# Patient Record
Sex: Female | Born: 1950 | Race: Black or African American | Hispanic: No | Smoking: Never smoker
Health system: Southern US, Community
[De-identification: ages and names within clinical notes are randomized; demographics above are authoritative.]

## PROBLEM LIST (undated history)

## (undated) DIAGNOSIS — E785 Hyperlipidemia, unspecified: Secondary | ICD-10-CM

## (undated) DIAGNOSIS — I10 Essential (primary) hypertension: Secondary | ICD-10-CM

---

## 2004-08-25 ENCOUNTER — Ambulatory Visit: Payer: Self-pay | Admitting: Family Medicine

## 2004-09-18 ENCOUNTER — Ambulatory Visit: Payer: Self-pay

## 2005-09-09 ENCOUNTER — Ambulatory Visit: Payer: Self-pay | Admitting: Family Medicine

## 2006-10-20 ENCOUNTER — Ambulatory Visit: Payer: Self-pay | Admitting: Family Medicine

## 2008-02-06 ENCOUNTER — Ambulatory Visit: Payer: Self-pay | Admitting: Family Medicine

## 2009-02-10 ENCOUNTER — Ambulatory Visit: Payer: Self-pay | Admitting: Family Medicine

## 2010-02-11 ENCOUNTER — Ambulatory Visit: Payer: Self-pay | Admitting: Family Medicine

## 2010-07-28 ENCOUNTER — Ambulatory Visit: Payer: Self-pay | Admitting: Internal Medicine

## 2011-04-01 ENCOUNTER — Ambulatory Visit: Payer: Self-pay | Admitting: Family Medicine

## 2012-07-17 ENCOUNTER — Ambulatory Visit: Payer: Self-pay | Admitting: Family Medicine

## 2013-07-23 ENCOUNTER — Ambulatory Visit: Payer: Self-pay | Admitting: Family Medicine

## 2013-10-17 ENCOUNTER — Emergency Department: Payer: Self-pay | Admitting: Emergency Medicine

## 2013-10-17 LAB — CBC WITH DIFFERENTIAL/PLATELET
BASOS ABS: 0 10*3/uL (ref 0.0–0.1)
BASOS PCT: 0.7 %
EOS ABS: 0 10*3/uL (ref 0.0–0.7)
Eosinophil %: 1 %
HCT: 43.3 % (ref 35.0–47.0)
HGB: 14 g/dL (ref 12.0–16.0)
LYMPHS ABS: 1.4 10*3/uL (ref 1.0–3.6)
LYMPHS PCT: 28.3 %
MCH: 27.9 pg (ref 26.0–34.0)
MCHC: 32.3 g/dL (ref 32.0–36.0)
MCV: 86 fL (ref 80–100)
Monocyte #: 0.4 x10 3/mm (ref 0.2–0.9)
Monocyte %: 8.8 %
Neutrophil #: 3 10*3/uL (ref 1.4–6.5)
Neutrophil %: 61.2 %
PLATELETS: 309 10*3/uL (ref 150–440)
RBC: 5.02 10*6/uL (ref 3.80–5.20)
RDW: 13.2 % (ref 11.5–14.5)
WBC: 4.9 10*3/uL (ref 3.6–11.0)

## 2013-10-17 LAB — BASIC METABOLIC PANEL
ANION GAP: 5 — AB (ref 7–16)
BUN: 19 mg/dL — ABNORMAL HIGH (ref 7–18)
CALCIUM: 9.4 mg/dL (ref 8.5–10.1)
CHLORIDE: 103 mmol/L (ref 98–107)
CO2: 30 mmol/L (ref 21–32)
Creatinine: 1.17 mg/dL (ref 0.60–1.30)
EGFR (African American): 57 — ABNORMAL LOW
EGFR (Non-African Amer.): 50 — ABNORMAL LOW
Glucose: 88 mg/dL (ref 65–99)
Osmolality: 277 (ref 275–301)
Potassium: 3.7 mmol/L (ref 3.5–5.1)
SODIUM: 138 mmol/L (ref 136–145)

## 2014-07-25 ENCOUNTER — Ambulatory Visit: Payer: Self-pay | Admitting: Family Medicine

## 2014-11-07 ENCOUNTER — Encounter: Payer: Self-pay | Admitting: *Deleted

## 2014-11-07 ENCOUNTER — Encounter
Admission: RE | Disposition: A | Discharge status: Home / Self Care | Payer: Self-pay | Source: Ambulatory Visit | Attending: Gastroenterology

## 2014-11-07 ENCOUNTER — Ambulatory Visit: Payer: 59 | Admitting: Anesthesiology

## 2014-11-07 ENCOUNTER — Ambulatory Visit
Admission: RE | Admit: 2014-11-07 | Discharge: 2014-11-07 | Disposition: A | Discharge status: Home / Self Care | Payer: 59 | Source: Ambulatory Visit | Attending: Gastroenterology | Admitting: Gastroenterology

## 2014-11-07 DIAGNOSIS — K5730 Diverticulosis of large intestine without perforation or abscess without bleeding: Secondary | ICD-10-CM | POA: Insufficient documentation

## 2014-11-07 DIAGNOSIS — I10 Essential (primary) hypertension: Secondary | ICD-10-CM | POA: Diagnosis not present

## 2014-11-07 DIAGNOSIS — Z7982 Long term (current) use of aspirin: Secondary | ICD-10-CM | POA: Diagnosis not present

## 2014-11-07 DIAGNOSIS — Z1211 Encounter for screening for malignant neoplasm of colon: Secondary | ICD-10-CM | POA: Insufficient documentation

## 2014-11-07 DIAGNOSIS — Z79899 Other long term (current) drug therapy: Secondary | ICD-10-CM | POA: Diagnosis not present

## 2014-11-07 HISTORY — PX: COLONOSCOPY: SHX5424

## 2014-11-07 HISTORY — DX: Essential (primary) hypertension: I10

## 2014-11-07 SURGERY — COLONOSCOPY
Anesthesia: General

## 2014-11-07 MED ORDER — SODIUM CHLORIDE 0.9 % IV SOLN
INTRAVENOUS | Status: DC
  Administered 2014-11-07: 08:00:00 via INTRAVENOUS

## 2014-11-07 MED ORDER — PROPOFOL 10 MG/ML IV BOLUS
INTRAVENOUS | Status: DC | PRN
  Administered 2014-11-07: 50 mg via INTRAVENOUS

## 2014-11-07 MED ORDER — PROPOFOL INFUSION 10 MG/ML OPTIME
INTRAVENOUS | Status: DC | PRN
  Administered 2014-11-07: 100 ug/kg/min via INTRAVENOUS

## 2014-11-07 MED ORDER — PHENYLEPHRINE HCL 10 MG/ML IJ SOLN
INTRAMUSCULAR | Status: DC | PRN
  Administered 2014-11-07 (×2): 100 ug via INTRAVENOUS

## 2014-11-07 MED ORDER — LIDOCAINE HCL (CARDIAC) 20 MG/ML IV SOLN
INTRAVENOUS | Status: DC | PRN
  Administered 2014-11-07: 60 mg via INTRAVENOUS

## 2014-11-07 NOTE — Anesthesia Postprocedure Evaluation (Signed)
  Anesthesia Post-op Note  Patient: Tina Barrett  Procedure(s) Performed: Procedure(s): COLONOSCOPY (N/A)  Anesthesia type:General  Patient location: PACU  Post pain: Pain level controlled  Post assessment: Post-op Vital signs reviewed, Patient's Cardiovascular Status Stable, Respiratory Function Stable, Patent Airway and No signs of Nausea or vomiting  Post vital signs: Reviewed and stable  Last Vitals:  Filed Vitals:   11/07/14 0900  BP:   Pulse: 66  Temp:   Resp: 16    Level of consciousness: awake, alert  and patient cooperative  Complications: No apparent anesthesia complications

## 2014-11-07 NOTE — Anesthesia Preprocedure Evaluation (Signed)
Anesthesia Evaluation  Patient identified by MRN, date of birth, ID band Patient awake    Reviewed: Allergy & Precautions, NPO status , Patient's Chart, lab work & pertinent test results  History of Anesthesia Complications Negative for: history of anesthetic complications  Airway Mallampati: II  TM Distance: >3 FB Neck ROM: Full    Dental  (+) Teeth Intact   Pulmonary          Cardiovascular hypertension (pt off meds now),     Neuro/Psych    GI/Hepatic   Endo/Other    Renal/GU      Musculoskeletal   Abdominal   Peds  Hematology   Anesthesia Other Findings   Reproductive/Obstetrics                             Anesthesia Physical Anesthesia Plan  ASA: II  Anesthesia Plan: General   Post-op Pain Management:    Induction: Intravenous  Airway Management Planned: Nasal Cannula  Additional Equipment:   Intra-op Plan:   Post-operative Plan:   Informed Consent: I have reviewed the patients History and Physical, chart, labs and discussed the procedure including the risks, benefits and alternatives for the proposed anesthesia with the patient or authorized representative who has indicated his/her understanding and acceptance.     Plan Discussed with:   Anesthesia Plan Comments:         Anesthesia Quick Evaluation

## 2014-11-07 NOTE — H&P (Signed)
Outpatient short stay form Pre-procedure 11/07/2014 8:09 AM Christena Deem MD  Primary Physician: Dr. Dorothey Baseman  Reason for visit:  Screening colonoscopy  History of present illness:  Patient is 64 year old female presenting today for screening colonoscopy. No family history of colon cancer or colon polyps. She has not taken an aspirin several days. He does not take anticoagulation medications. Tolerated her prep well. He did have some emesis with a very last part of it.    Current facility-administered medications:  .  0.9 %  sodium chloride infusion, , Intravenous, Continuous, Christena Deem, MD, Last Rate: 50 mL/hr at 11/07/14 0737 .  0.9 %  sodium chloride infusion, , Intravenous, Continuous, Christena Deem, MD  Prescriptions prior to admission  Medication Sig Dispense Refill Last Dose  . aspirin 81 MG tablet Take 81 mg by mouth daily.   Past Week at Unknown time  . Omega-3 Fatty Acids (FISH OIL) 1000 MG CAPS Take 1 capsule by mouth daily.   Past Week at Unknown time  . triamterene-hydrochlorothiazide (DYAZIDE) 37.5-25 MG per capsule Take 1 capsule by mouth daily.   Completed Course at Unknown time     No Known Allergies   Past Medical History  Diagnosis Date  . Hypertension     Review of systems:      Physical Exam    Heart and lungs: Irregular rhythm possible trigeminy. No rubs or gallop. Lungs are bilaterally clear to auscultation    HEENT: Normocephalic atraumatic eyes are anicteric    Other:     Pertinant exam for procedure: Soft nontender nondistended bowel sounds positive normoactive    Planned proceedures: Colonoscopy and indicated procedures. I have discussed the risks benefits and complications of procedures to include not limited to bleeding, infection, perforation and the risk of sedation and the patient wishes to proceed. I have discussed the risks benefits and complications of procedures to include not limited to bleeding, infection,  perforation and the risk of sedation and the patient wishes to proceed.    Christena Deem, MD Gastroenterology 11/07/2014  8:09 AM

## 2014-11-07 NOTE — Op Note (Signed)
[facility] Gastroenterology Patient Name: Tina Barrett Procedure Date: 11/07/2014 8:17 AM MRN: [id number] Account #: [id number] Date of Birth: May 10, 1951 Admit Type: Outpatient Age: 64 Room: [facility] ENDO ROOM 4 Gender: Female Note Status: Finalized Procedure:         Colonoscopy Indications:       Screening for colorectal malignant neoplasm Providers:         Christena Deem, MD Referring MD:      Teena Irani. Terance Hart, MD (Referring MD) Medicines:         Monitored Anesthesia Care Complications:     No immediate complications. Procedure:         Pre-Anesthesia Assessment:                    - ASA Grade Assessment: II - A patient with mild systemic                     disease.                    After obtaining informed consent, the colonoscope was                     passed under direct vision. Throughout the procedure, the                     patient's blood pressure, pulse, and oxygen saturations                     were monitored continuously. The Colonoscope was                     introduced through the anus and advanced to the the cecum,                     identified by appendiceal orifice and ileocecal valve. The                     colonoscopy was performed without difficulty. The patient                     tolerated the procedure well. The quality of the bowel                     preparation was good. Findings:      Multiple small-mouthed diverticula were found in the sigmoid colon and       in the distal descending colon.      Multiple small-mouthed diverticula were found in the sigmoid colon.       Petechia were visualized in association with the diverticular opening.      The exam was otherwise normal throughout the examined colon.      The retroflexed view of the distal rectum and anal verge was normal and       showed no anal or rectal abnormalities.      The digital rectal exam was normal. Impression:        - Diverticulosis in the sigmoid colon  and in the distal                     descending colon.                    - Mild diverticulosis in the sigmoid colon. Petechia were  visualized in association with the diverticular opening.                    - The distal rectum and anal verge are normal on                     retroflexion view.                    - No specimens collected. Recommendation:    - Use Citrucel one tablespoon PO daily daily. Procedure Code(s): --- Professional ---                    [phone], Colonoscopy, flexible; diagnostic, including                     collection of specimen(s) by brushing or washing, when                     performed (separate procedure) Diagnosis Code(s): --- Professional ---                    V76.51, Special screening for malignant neoplasms of colon                    562.10, Diverticulosis of colon (without mention of                     hemorrhage) CPT copyright 2014 American Medical Association. All rights reserved. The codes documented in this report are preliminary and upon coder review may  be revised to meet current compliance requirements. Christena Deem, MD 11/07/2014 8:41:41 AM This report has been signed electronically. Number of Addenda: 0 Note Initiated On: 11/07/2014 8:17 AM Scope Withdrawal Time: 0 hours 6 minutes 11 seconds  Total Procedure Duration: 0 hours 11 minutes 40 seconds       [facility]

## 2014-11-07 NOTE — Transfer of Care (Signed)
Immediate Anesthesia Transfer of Care Note  Patient: Tina Barrett  Procedure(s) Performed: Procedure(s): COLONOSCOPY (N/A)  Patient Location: Endoscopy Unit  Anesthesia Type:General  Level of Consciousness: awake, alert , oriented and patient cooperative  Airway & Oxygen Therapy: Patient Spontanous Breathing and Patient connected to nasal cannula oxygen  Post-op Assessment: Report given to RN, Post -op Vital signs reviewed and stable and Patient moving all extremities X 4  Post vital signs: Reviewed and stable  Last Vitals:  Filed Vitals:   11/07/14 0849  BP: 107/61  Pulse: 71  Temp: 35.6 C  Resp: 17    Complications: No apparent anesthesia complications

## 2014-11-11 ENCOUNTER — Encounter: Payer: Self-pay | Admitting: Gastroenterology

## 2015-07-24 ENCOUNTER — Other Ambulatory Visit: Payer: Self-pay | Admitting: Family Medicine

## 2015-07-24 DIAGNOSIS — Z1231 Encounter for screening mammogram for malignant neoplasm of breast: Secondary | ICD-10-CM

## 2015-08-07 ENCOUNTER — Ambulatory Visit: Payer: 59

## 2015-08-18 ENCOUNTER — Ambulatory Visit
Admission: RE | Admit: 2015-08-18 | Discharge: 2015-08-18 | Disposition: A | Discharge status: Home / Self Care | Payer: Medicare Other | Source: Ambulatory Visit | Attending: Family Medicine | Admitting: Family Medicine

## 2015-08-18 ENCOUNTER — Other Ambulatory Visit: Payer: Self-pay | Admitting: Family Medicine

## 2015-08-18 DIAGNOSIS — Z1231 Encounter for screening mammogram for malignant neoplasm of breast: Secondary | ICD-10-CM

## 2015-10-29 ENCOUNTER — Other Ambulatory Visit: Payer: Self-pay | Admitting: Unknown Physician Specialty

## 2015-10-29 DIAGNOSIS — R0789 Other chest pain: Secondary | ICD-10-CM

## 2015-10-30 ENCOUNTER — Ambulatory Visit
Admission: RE | Admit: 2015-10-30 | Discharge: 2015-10-30 | Disposition: A | Discharge status: Home / Self Care | Payer: Medicare Other | Source: Ambulatory Visit | Attending: Unknown Physician Specialty | Admitting: Unknown Physician Specialty

## 2015-10-30 DIAGNOSIS — R0789 Other chest pain: Secondary | ICD-10-CM | POA: Insufficient documentation

## 2015-10-30 DIAGNOSIS — M47894 Other spondylosis, thoracic region: Secondary | ICD-10-CM | POA: Diagnosis not present

## 2015-10-30 MED ORDER — IOPAMIDOL (ISOVUE-300) INJECTION 61%
75.0000 mL | Freq: Once | INTRAVENOUS | Status: AC | PRN
  Administered 2015-10-30: 75 mL via INTRAVENOUS

## 2016-07-20 ENCOUNTER — Other Ambulatory Visit: Payer: Self-pay | Admitting: Family Medicine

## 2016-07-20 DIAGNOSIS — Z1239 Encounter for other screening for malignant neoplasm of breast: Secondary | ICD-10-CM

## 2016-08-19 ENCOUNTER — Ambulatory Visit
Admission: RE | Admit: 2016-08-19 | Discharge: 2016-08-19 | Disposition: A | Discharge status: Home / Self Care | Payer: 59 | Source: Ambulatory Visit | Attending: Family Medicine | Admitting: Family Medicine

## 2016-08-19 DIAGNOSIS — Z1239 Encounter for other screening for malignant neoplasm of breast: Secondary | ICD-10-CM

## 2016-08-19 DIAGNOSIS — Z1231 Encounter for screening mammogram for malignant neoplasm of breast: Secondary | ICD-10-CM | POA: Insufficient documentation

## 2017-01-16 ENCOUNTER — Encounter: Payer: Self-pay | Admitting: Emergency Medicine

## 2017-01-16 ENCOUNTER — Emergency Department
Admission: EM | Admit: 2017-01-16 | Discharge: 2017-01-16 | Disposition: A | Discharge status: Home / Self Care | Payer: Medicare Other | Attending: Emergency Medicine | Admitting: Emergency Medicine

## 2017-01-16 DIAGNOSIS — Z7982 Long term (current) use of aspirin: Secondary | ICD-10-CM | POA: Insufficient documentation

## 2017-01-16 DIAGNOSIS — L508 Other urticaria: Secondary | ICD-10-CM | POA: Diagnosis not present

## 2017-01-16 DIAGNOSIS — Z79899 Other long term (current) drug therapy: Secondary | ICD-10-CM | POA: Insufficient documentation

## 2017-01-16 DIAGNOSIS — L509 Urticaria, unspecified: Secondary | ICD-10-CM | POA: Diagnosis present

## 2017-01-16 MED ORDER — PREDNISONE 10 MG PO TABS: ORAL_TABLET | ORAL | 0 refills | Status: AC

## 2017-01-16 MED ORDER — DEXAMETHASONE SODIUM PHOSPHATE 10 MG/ML IJ SOLN
10.0000 mg | Freq: Once | INTRAMUSCULAR | Status: AC
  Administered 2017-01-16: 10 mg via INTRAVENOUS
  Filled 2017-01-16: qty 1

## 2017-01-16 MED ORDER — DIPHENHYDRAMINE HCL 50 MG/ML IJ SOLN
25.0000 mg | Freq: Once | INTRAMUSCULAR | Status: AC
  Administered 2017-01-16: 25 mg via INTRAVENOUS
  Filled 2017-01-16: qty 1

## 2017-01-16 MED ORDER — FAMOTIDINE IN NACL 20-0.9 MG/50ML-% IV SOLN
20.0000 mg | Freq: Once | INTRAVENOUS | Status: AC
  Administered 2017-01-16: 20 mg via INTRAVENOUS
  Filled 2017-01-16: qty 50

## 2017-01-16 MED ORDER — RANITIDINE HCL 150 MG PO TABS: 150.0000 mg | ORAL_TABLET | Freq: Two times a day (BID) | ORAL | 0 refills | Status: AC

## 2017-01-16 NOTE — ED Provider Notes (Signed)
[facility] Emergency Department Provider Note  ___________________________________________   First MD Initiated Contact with Patient 01/16/17 1144     (approximate)  I have reviewed the triage vital signs and the nursing notes.   HISTORY  Chief Complaint Urticaria   HPI Tina Barrett is a 66 y.o. female is here with complaint of hives. Patient states she noted yesterday with hives but did not have any problems breathing and despite her husband's request to come to the emergency room she decided stay home. This morning she has more hives but still no difficulty with swallowing, talking or breathing. Patient denies any previous problems. She is not taking any over-the-counter medication.   History reviewed. No pertinent past medical history.  There are no active problems to display for this patient.   Past Surgical History:  Procedure Laterality Date  . COLONOSCOPY N/A 11/07/2014   Procedure: COLONOSCOPY;  Surgeon: Christena Deem, MD;  Location: [facility] ENDOSCOPY;  Service: Endoscopy;  Laterality: N/A;    Prior to Admission medications   Medication Sig Start Date End Date Taking? Authorizing Provider  aspirin 81 MG tablet Take 81 mg by mouth daily.    [provider]  Omega-3 Fatty Acids (FISH OIL) 1000 MG CAPS Take 1 capsule by mouth daily.    [provider]  predniSONE (DELTASONE) 10 MG tablet Take 6 tablets  today, on day 2 take 5 tablets, day 3 take 4 tablets, day 4 take 3 tablets, day 5 take  2 tablets and 1 tablet the last day 01/16/17   Tommi Rumps, PA-C  ranitidine (ZANTAC) 150 MG tablet Take 1 tablet (150 mg total) by mouth 2 (two) times daily. 01/16/17 01/16/18  Tommi Rumps, PA-C  triamterene-hydrochlorothiazide (DYAZIDE) 37.5-25 MG per capsule Take 1 capsule by mouth daily.    [provider]    Allergies Patient has no known allergies.  Family History  Problem Relation Age of Onset  . Breast  cancer Maternal Aunt     Social History Social History  Substance Use Topics  . Smoking status: Never Smoker  . Smokeless tobacco: Never Used  . Alcohol use No    Review of Systems Constitutional: No fever/chills Eyes: No visual changes. ENT: No sore throat.Negative for difficulty swallowing or speaking. Cardiovascular: Denies chest pain. Respiratory: Denies shortness of breath. Skin: Positive for rash and itching. Neurological: Negative for headaches, focal weakness or numbness.   ____________________________________________   PHYSICAL EXAM:  VITAL SIGNS: ED Triage Vitals  Enc Vitals Group     BP      Pulse      Resp      Temp      Temp src      SpO2      Weight      Height      Head Circumference      Peak Flow      Pain Score      Pain Loc      Pain Edu?      Excl. in GC?    Constitutional: Alert and oriented. Well appearing and in no acute distress. Eyes: Conjunctivae are normal. Head: Atraumatic. Nose: No congestion/rhinnorhea. Mouth/Throat: Mucous membranes are moist.  Oropharynx non-erythematous.No edema and uvula is midline. Neck: No stridor.   Cardiovascular: Normal rate, rhythm. Grossly normal heart sounds.  Good peripheral circulation. Respiratory: Normal respiratory effort.  No retractions. Lungs CTAB. Musculoskeletal: Moves upper and lower extremities without difficulty and normal gait was noted.  Neurologic:  Normal speech and language. No gross focal neurologic deficits are appreciated.  Skin:  Skin is warm, dry and intact. Irregular, erythematous patches diffusely over upper and lower extremities. Also noted on anterior and posterior trunk. Psychiatric: Mood and affect are normal. Speech and behavior are normal.  ____________________________________________   LABS (all labs ordered are listed, but only abnormal results are displayed)  Labs Reviewed - No data to display   PROCEDURES  Procedure(s) performed:  None  Procedures  Critical Care performed: No  ____________________________________________   INITIAL IMPRESSION / ASSESSMENT AND PLAN / ED COURSE  Pertinent labs & imaging results that were available during my care of the patient were reviewed by me and considered in my medical decision making (see chart for details).  Patient did well while in the emergency department. IV was started and patient was given Benadryl 25 mg, Decadron 10 mg, Pepcid 20 mg IV. Patient states that she had little to no itching at the time of her discharge. Patient continued to speak without any difficulties and denied any difficulty breathing. Reevaluation of the patient's rash was completely resolved on both upper and lower extremities. There is only a small area noted to the right anterior abdomen. Patient was discharged with a prescription for Zantac 150 milligrams one twice a day and continued prednisone as a taper. Patient is to follow-up with her PCP and if continued problems with urticaria will most likely need allergy testing.    ___________________________________________   FINAL CLINICAL IMPRESSION(S) / ED DIAGNOSES  Final diagnoses:  Urticaria, acute      NEW MEDICATIONS STARTED DURING THIS VISIT:  Discharge Medication List as of 01/16/2017  1:40 PM    START taking these medications   Details  predniSONE (DELTASONE) 10 MG tablet Take 6 tablets  today, on day 2 take 5 tablets, day 3 take 4 tablets, day 4 take 3 tablets, day 5 take  2 tablets and 1 tablet the last day, Print    ranitidine (ZANTAC) 150 MG tablet Take 1 tablet (150 mg total) by mouth 2 (two) times daily., Starting Sun 01/16/2017, Until Mon 01/16/2018, Print         Note:  This document was prepared using Dragon voice recognition software and may include unintentional dictation errors.    Tommi RumpsSummers, Zenab Gronewold L, PA-C 01/16/17 1753    Jene EveryKinner, Robert, MD 01/18/17 [id number]

## 2017-01-16 NOTE — Discharge Instructions (Signed)
Follow-up with Dr. Terance HartBronstein if any continued problems. If you continue to have hives you will need to be worked up for allergies and most likely referred to [facility] for this. Continue taking medication as directed. Zantac twice a day which is a H2 blocker that will help with hives. Prednisone in a tapering dose. may also take Benadryl as needed for itching.  Benadryl 1 or 2 capsules every 6 hours for itching as needed. Return to the emergency room if any severe worsening of her symptoms.

## 2017-01-16 NOTE — ED Notes (Signed)
FIRST NURSE NOTE:  Pt states she noticed hives yesterday, has not taken any benadryl. Pt continues to itch.  Hives on arms and legs and back of the neck. Pt in no distress at this time.

## 2017-01-16 NOTE — ED Triage Notes (Signed)
Pt states that when she woke up yesterday with hives, pt states that she has not had any trouble breathing. Pt has not started any new medications, no environmental changes. Pt states that she did try lamb for the first time a few days ago.

## 2017-07-14 ENCOUNTER — Other Ambulatory Visit: Payer: Self-pay | Admitting: Family Medicine

## 2017-07-14 DIAGNOSIS — Z1231 Encounter for screening mammogram for malignant neoplasm of breast: Secondary | ICD-10-CM

## 2017-08-22 ENCOUNTER — Ambulatory Visit
Admission: RE | Admit: 2017-08-22 | Discharge: 2017-08-22 | Disposition: A | Discharge status: Home / Self Care | Payer: Medicare Other | Source: Ambulatory Visit | Attending: Family Medicine | Admitting: Family Medicine

## 2017-08-22 DIAGNOSIS — Z1231 Encounter for screening mammogram for malignant neoplasm of breast: Secondary | ICD-10-CM | POA: Diagnosis present

## 2018-05-23 ENCOUNTER — Other Ambulatory Visit: Payer: Self-pay | Admitting: Family Medicine

## 2018-05-23 DIAGNOSIS — Z1231 Encounter for screening mammogram for malignant neoplasm of breast: Secondary | ICD-10-CM

## 2018-07-21 ENCOUNTER — Emergency Department: Payer: Medicare Other

## 2018-07-21 ENCOUNTER — Other Ambulatory Visit: Payer: Self-pay

## 2018-07-21 ENCOUNTER — Encounter: Payer: Self-pay | Admitting: *Deleted

## 2018-07-21 ENCOUNTER — Emergency Department
Admission: EM | Admit: 2018-07-21 | Discharge: 2018-07-22 | Disposition: A | Discharge status: Home / Self Care | Payer: Medicare Other | Attending: Emergency Medicine | Admitting: Emergency Medicine

## 2018-07-21 DIAGNOSIS — R079 Chest pain, unspecified: Secondary | ICD-10-CM

## 2018-07-21 LAB — CBC
HCT: 41.9 % (ref 36.0–46.0)
Hemoglobin: 13.6 g/dL (ref 12.0–15.0)
MCH: 27.1 pg (ref 26.0–34.0)
MCHC: 32.5 g/dL (ref 30.0–36.0)
MCV: 83.5 fL (ref 80.0–100.0)
Platelets: 352 10*3/uL (ref 150–400)
RBC: 5.02 MIL/uL (ref 3.87–5.11)
RDW: 12.4 % (ref 11.5–15.5)
WBC: 6.3 10*3/uL (ref 4.0–10.5)
nRBC: 0 % (ref 0.0–0.2)

## 2018-07-21 LAB — BASIC METABOLIC PANEL
Anion gap: 10 (ref 5–15)
BUN: 16 mg/dL (ref 8–23)
CO2: 29 mmol/L (ref 22–32)
Calcium: 9 mg/dL (ref 8.9–10.3)
Chloride: 99 mmol/L (ref 98–111)
Creatinine, Ser: 0.92 mg/dL (ref 0.44–1.00)
GFR calc Af Amer: >60 mL/min (ref >60)
GFR calc non Af Amer: >60 mL/min (ref >60)
Glucose, Bld: 140 mg/dL — ABNORMAL HIGH (ref 70–99)
Potassium: 2.9 mmol/L — ABNORMAL LOW (ref 3.5–5.1)
Sodium: 138 mmol/L (ref 135–145)

## 2018-07-21 LAB — TROPONIN I
Troponin I: <0.03 ng/mL (ref <0.03)
Troponin I: <0.03 ng/mL (ref <0.03)

## 2018-07-21 MED ORDER — SODIUM CHLORIDE 0.9% FLUSH: 3.0000 mL | Freq: Once | INTRAVENOUS | Status: DC

## 2018-07-21 MED ORDER — ASPIRIN 81 MG PO CHEW
324.0000 mg | CHEWABLE_TABLET | Freq: Once | ORAL | Status: AC
  Administered 2018-07-21: 243 mg via ORAL
  Filled 2018-07-21: qty 4

## 2018-07-21 NOTE — ED Notes (Signed)
Pt resting in bed comfortably.

## 2018-07-21 NOTE — ED Provider Notes (Signed)
[facility] Emergency Department Provider Note       Time seen: ----------------------------------------- 8:26 PM on 07/21/2018 -----------------------------------------   I have reviewed the triage vital signs and the nursing notes.  HISTORY   Chief Complaint Chest Pain    HPI Tina Barrett is a 68 y.o. female with a history of no significant past medical history who presents to the ER with central chest pain that started less than an hour ago and was sharp in nature.  She reports the pain initially started with some lightheadedness.  She has increased work of breathing.  She has not had any recent sickness, diaphoresis or cough.  She denies pleuritic pain.  History reviewed. No pertinent past medical history.  There are no active problems to display for this patient.   Past Surgical History:  Procedure Laterality Date  . COLONOSCOPY N/A 11/07/2014   Procedure: COLONOSCOPY;  Surgeon: Christena Deem, MD;  Location: [facility] ENDOSCOPY;  Service: Endoscopy;  Laterality: N/A;    Allergies Patient has no known allergies.  Social History Social History   Tobacco Use  . Smoking status: Never Smoker  . Smokeless tobacco: Never Used  Substance Use Topics  . Alcohol use: No  . Drug use: No   Review of Systems Constitutional: Negative for fever. Cardiovascular: Positive for chest pain Respiratory: Negative for shortness of breath. Gastrointestinal: Negative for abdominal pain, vomiting and diarrhea. Musculoskeletal: Negative for back pain. Skin: Negative for rash. Neurological: Negative for headaches, focal weakness or numbness.  All systems negative/normal/unremarkable except as stated in the HPI  ____________________________________________   PHYSICAL EXAM:  VITAL SIGNS: ED Triage Vitals [07/21/18 2025]  Enc Vitals Group     BP      Pulse      Resp      Temp      Temp src      SpO2      Weight 156 lb (70.8 kg)     Height 5'  (1.524 m)     Head Circumference      Peak Flow      Pain Score 9     Pain Loc      Pain Edu?      Excl. in GC?    Constitutional: Alert and oriented. Well appearing and in no distress. Eyes: Conjunctivae are normal. Normal extraocular movements. ENT      Head: Normocephalic and atraumatic.      Nose: No congestion/rhinnorhea.      Mouth/Throat: Mucous membranes are moist.      Neck: No stridor. Cardiovascular: Normal rate, regular rhythm. No murmurs, rubs, or gallops. Respiratory: Normal respiratory effort without tachypnea nor retractions. Breath sounds are clear and equal bilaterally. No wheezes/rales/rhonchi. Gastrointestinal: Soft and nontender. Normal bowel sounds Musculoskeletal: Nontender with normal range of motion in extremities. No lower extremity tenderness nor edema. Neurologic:  Normal speech and language. No gross focal neurologic deficits are appreciated.  Skin:  Skin is warm, dry and intact. No rash noted. Psychiatric: Mood and affect are normal. Speech and behavior are normal.  ____________________________________________  EKG: Interpreted by me.  Sinus tachycardia with rate of 103 bpm, rightward axis, normal QT  ____________________________________________  ED COURSE:  As part of my medical decision making, I reviewed the following data within the electronic MEDICAL RECORD NUMBER History obtained from family if available, nursing notes, old chart and ekg, as well as notes from prior ED visits. Patient presented for chest pain, we will assess with labs and  imaging as indicated at this time.   Procedures ____________________________________________   LABS (pertinent positives/negatives)  Labs Reviewed  BASIC METABOLIC PANEL - Abnormal; Notable for the following components:      Result Value   Potassium 2.9 (*)    Glucose, Bld 140 (*)    All other components within normal limits  CBC  TROPONIN I  TROPONIN I    RADIOLOGY Images were viewed by me  Chest  x-ray IMPRESSION: 1. No acute cardiopulmonary disease. ____________________________________________   DIFFERENTIAL DIAGNOSIS   Muscular skeletal pain, GERD, anxiety, unstable angina, MI  FINAL ASSESSMENT AND PLAN  Chest pain   Plan: The patient had presented for chest pain. Patient's labs did not reveal any acute process, repeat troponin was negative.  This was a true 3-hour repeat troponin.  Patient's imaging was negative.  She is cleared for outpatient follow-up.   Ulice Dash, MD    Note: This note was generated in part or whole with voice recognition software. Voice recognition is usually quite accurate but there are transcription errors that can and very often do occur. I apologize for any typographical errors that were not detected and corrected.     Emily Filbert, MD 07/21/18 [phone]

## 2018-07-21 NOTE — ED Triage Notes (Signed)
Pt to ED reporting centralized chest pain that started less than an hour ago and is intermittent but sharp in nature. Pt reports when the pain initially started she became lightheaded. PT has increased WOB upon arrival to ED. No diaphoresis, cough, or increased pain with palpation.

## 2018-07-21 NOTE — ED Notes (Signed)
MD at bedside. 

## 2019-06-25 ENCOUNTER — Other Ambulatory Visit: Payer: Self-pay | Admitting: Family Medicine

## 2019-06-25 DIAGNOSIS — Z1231 Encounter for screening mammogram for malignant neoplasm of breast: Secondary | ICD-10-CM

## 2019-07-03 ENCOUNTER — Ambulatory Visit
Admission: RE | Admit: 2019-07-03 | Discharge: 2019-07-03 | Disposition: A | Discharge status: Home / Self Care | Payer: Medicare Other | Source: Ambulatory Visit | Attending: Family Medicine | Admitting: Family Medicine

## 2019-07-03 DIAGNOSIS — Z1231 Encounter for screening mammogram for malignant neoplasm of breast: Secondary | ICD-10-CM | POA: Insufficient documentation

## 2020-05-28 ENCOUNTER — Other Ambulatory Visit: Payer: Self-pay | Admitting: Family Medicine

## 2020-05-28 DIAGNOSIS — Z1231 Encounter for screening mammogram for malignant neoplasm of breast: Secondary | ICD-10-CM

## 2020-07-07 ENCOUNTER — Ambulatory Visit
Admission: RE | Admit: 2020-07-07 | Discharge: 2020-07-07 | Disposition: A | Discharge status: Home / Self Care | Payer: Medicare Other | Source: Ambulatory Visit | Attending: Family Medicine | Admitting: Family Medicine

## 2020-07-07 ENCOUNTER — Other Ambulatory Visit: Payer: Self-pay

## 2020-07-07 DIAGNOSIS — Z1231 Encounter for screening mammogram for malignant neoplasm of breast: Secondary | ICD-10-CM | POA: Insufficient documentation

## 2020-07-14 ENCOUNTER — Emergency Department
Admission: EM | Admit: 2020-07-14 | Discharge: 2020-07-14 | Disposition: A | Discharge status: Home / Self Care | Payer: Medicare Other | Attending: Emergency Medicine | Admitting: Emergency Medicine

## 2020-07-14 ENCOUNTER — Other Ambulatory Visit: Payer: Self-pay

## 2020-07-14 DIAGNOSIS — R55 Syncope and collapse: Secondary | ICD-10-CM

## 2020-07-14 DIAGNOSIS — R000 Tachycardia, unspecified: Secondary | ICD-10-CM | POA: Insufficient documentation

## 2020-07-14 DIAGNOSIS — I10 Essential (primary) hypertension: Secondary | ICD-10-CM | POA: Diagnosis not present

## 2020-07-14 DIAGNOSIS — E860 Dehydration: Secondary | ICD-10-CM

## 2020-07-14 DIAGNOSIS — Z7982 Long term (current) use of aspirin: Secondary | ICD-10-CM | POA: Insufficient documentation

## 2020-07-14 DIAGNOSIS — R42 Dizziness and giddiness: Secondary | ICD-10-CM | POA: Insufficient documentation

## 2020-07-14 DIAGNOSIS — Z79899 Other long term (current) drug therapy: Secondary | ICD-10-CM | POA: Insufficient documentation

## 2020-07-14 DIAGNOSIS — M25511 Pain in right shoulder: Secondary | ICD-10-CM | POA: Diagnosis not present

## 2020-07-14 HISTORY — DX: Hyperlipidemia, unspecified: E78.5

## 2020-07-14 LAB — BASIC METABOLIC PANEL
Anion gap: 13 (ref 5–15)
BUN: 25 mg/dL — ABNORMAL HIGH (ref 8–23)
CO2: 28 mmol/L (ref 22–32)
Calcium: 9.9 mg/dL (ref 8.9–10.3)
Chloride: 93 mmol/L — ABNORMAL LOW (ref 98–111)
Creatinine, Ser: 1.13 mg/dL — ABNORMAL HIGH (ref 0.44–1.00)
GFR, Estimated: 52 mL/min — ABNORMAL LOW (ref >60)
Glucose, Bld: 116 mg/dL — ABNORMAL HIGH (ref 70–99)
Potassium: 3.5 mmol/L (ref 3.5–5.1)
Sodium: 134 mmol/L — ABNORMAL LOW (ref 135–145)

## 2020-07-14 LAB — CBC
HCT: 42.4 % (ref 36.0–46.0)
Hemoglobin: 14.2 g/dL (ref 12.0–15.0)
MCH: 27.7 pg (ref 26.0–34.0)
MCHC: 33.5 g/dL (ref 30.0–36.0)
MCV: 82.8 fL (ref 80.0–100.0)
Platelets: 375 10*3/uL (ref 150–400)
RBC: 5.12 MIL/uL — ABNORMAL HIGH (ref 3.87–5.11)
RDW: 12.2 % (ref 11.5–15.5)
WBC: 6.4 10*3/uL (ref 4.0–10.5)
nRBC: 0 % (ref 0.0–0.2)

## 2020-07-14 MED ORDER — LACTATED RINGERS IV BOLUS
1000.0000 mL | Freq: Once | INTRAVENOUS | Status: AC
  Administered 2020-07-14: 1000 mL via INTRAVENOUS

## 2020-07-14 NOTE — Discharge Instructions (Signed)
Drink plenty of fluids, even if you are not eating as much to try to lose weight.  It is important to maintain your hydration.  If you do pass out all the way or have any worsening symptoms, please return to the ED.

## 2020-07-14 NOTE — ED Triage Notes (Signed)
Pt states she took a hot bath and when she got out of the tub felt dizzy and had right arm pain, states she laid on the floor for a few minutes, states she broke out into a sweat. Pt is ambulatory to triage with a steady gait.Marland Kitchen denies any dizziness or pain at present

## 2020-07-14 NOTE — ED Provider Notes (Signed)
[facility] Emergency Department Provider Note ____________________________________________   Event Date/Time   First MD Initiated Contact with Patient 07/14/20 [phone]     (approximate)  I have reviewed the triage vital signs and the nursing notes.  HISTORY  Chief Complaint Near Syncope   HPI Tina Barrett is a 70 y.o. femalewho presents to the ED for evaluation of a near syncopal episode.  Chart review indicates HTN and HLD.  Patient reports that she has been "trying to get back to it" regarding eating better, exercising and losing weight for the past 5 days.  She reports cutting back on her p.o. intake and starting walking in the morning.  She reports a small dinner last night, but felt fine yesterday.  She reports waking up this morning and feeling mild lightheaded presyncopal dizziness, for which she drank some water.  She then drew up a hot bath, and reports feeling presyncopal lightheaded dizziness while in this bath.  She reports some associated discomfort to her right shoulder alongside this, diaphoresis and presyncope.  She reports that she got out of the tub, did not syncopized, but laid down purposefully on the cold tile floor and subsequently felt better.  She reports that she was about to go for her morning walk when she was concerned that that episode might happen again, so she plans to the ED for evaluation and "just to be safe."  She has not eaten anything today and reports that she feels fine now supine in the bed.  Past Medical History:  Diagnosis Date  . Hyperlipidemia   . Hypertension     There are no problems to display for this patient.   Past Surgical History:  Procedure Laterality Date  . COLONOSCOPY N/A 11/07/2014   Procedure: COLONOSCOPY;  Surgeon: Christena Deem, MD;  Location: [facility] ENDOSCOPY;  Service: Endoscopy;  Laterality: N/A;    Prior to Admission medications   Medication Sig Start Date End Date Taking?  Authorizing Provider  aspirin 81 MG tablet Take 81 mg by mouth daily.    [provider]  Omega-3 Fatty Acids (FISH OIL) 1000 MG CAPS Take 1 capsule by mouth daily.    [provider]  predniSONE (DELTASONE) 10 MG tablet Take 6 tablets  today, on day 2 take 5 tablets, day 3 take 4 tablets, day 4 take 3 tablets, day 5 take  2 tablets and 1 tablet the last day 01/16/17   Tommi Rumps, PA-C  ranitidine (ZANTAC) 150 MG tablet Take 1 tablet (150 mg total) by mouth 2 (two) times daily. 01/16/17 01/16/18  Tommi Rumps, PA-C  triamterene-hydrochlorothiazide (DYAZIDE) 37.5-25 MG per capsule Take 1 capsule by mouth daily.    [provider]    Allergies Patient has no known allergies.  Family History  Problem Relation Age of Onset  . Breast cancer Maternal Aunt     Social History Social History   Tobacco Use  . Smoking status: Never Smoker  . Smokeless tobacco: Never Used  Substance Use Topics  . Alcohol use: No  . Drug use: No   Review of Systems  Constitutional: No fever/chills Eyes: No visual changes. ENT: No sore throat. Cardiovascular: Denies chest pain. Respiratory: Denies shortness of breath. Gastrointestinal: No abdominal pain.  No nausea, no vomiting.  No diarrhea.  No constipation. Genitourinary: Negative for dysuria. Musculoskeletal: Negative for back pain.  Atraumatic right shoulder pain, resolved. Skin: Negative for rash. Neurological: Negative for headaches, focal weakness or numbness.  Presyncopal lightheaded dizziness. ____________________________________________  PHYSICAL EXAM:  VITAL SIGNS: Vitals:   07/14/20 1000 07/14/20 1030  BP: 117/77 121/74  Pulse: 83 84  Resp:  18  Temp:    SpO2: 100% 99%     Constitutional: Alert and oriented. Well appearing and in no acute distress. Eyes: Conjunctivae are normal. PERRL. EOMI. Head: Atraumatic. Nose: No congestion/rhinnorhea. Mouth/Throat: Mucous membranes are dry.  Oropharynx  non-erythematous. Neck: No stridor. No cervical spine tenderness to palpation. Cardiovascular: Tachycardic rate, regular rhythm. Grossly normal heart sounds.  Good peripheral circulation. Respiratory: Normal respiratory effort.  No retractions. Lungs CTAB. Gastrointestinal: Soft , nondistended, nontender to palpation. No CVA tenderness. Musculoskeletal: No lower extremity tenderness nor edema.  No joint effusions. No signs of acute trauma. Neurologic:  Normal speech and language. No gross focal neurologic deficits are appreciated. No gait instability noted. Skin:  Skin is warm, dry and intact. No rash noted. Psychiatric: Mood and affect are normal. Speech and behavior are normal.  ____________________________________________   LABS (all labs ordered are listed, but only abnormal results are displayed)  Labs Reviewed  BASIC METABOLIC PANEL - Abnormal; Notable for the following components:      Result Value   Sodium 134 (*)    Chloride 93 (*)    Glucose, Bld 116 (*)    BUN 25 (*)    Creatinine, Ser 1.13 (*)    GFR, Estimated 52 (*)    All other components within normal limits  CBC - Abnormal; Notable for the following components:   RBC 5.12 (*)    All other components within normal limits  URINALYSIS, COMPLETE (UACMP) WITH MICROSCOPIC  CBG MONITORING, ED   ____________________________________________  12 Lead EKG  Sinus rhythm, rate of 104 bpm.  Rightward axis.  Normal intervals.  No evidence of acute ischemia.  Sinus tachycardia. ____________________________________________   PROCEDURES and INTERVENTIONS  Procedure(s) performed (including Critical Care):  .1-3 Lead EKG Interpretation Performed by: Delton Prairie, MD Authorized by: Delton Prairie, MD     Interpretation: abnormal     ECG rate:  106   ECG rate assessment: tachycardic     Rhythm: sinus tachycardia     Ectopy: none     Conduction: normal   Comments:     Prior to IV fluids    Medications  lactated  ringers bolus 1,000 mL (0 mLs Intravenous Stopped 07/14/20 1109)    ____________________________________________   MDM / ED COURSE   Quite healthy 70 year old woman presents to the ED with a presyncopal episode that occurred during a hot bath, likely a vasovagal episode, without evidence of acute pathology, and amenable to outpatient management.  Initially tachycardic, resolving with IVF, and vitals otherwise normal on room air.  Exam with some stigmata of dehydration with her tachycardia and dry mucous membranes, she otherwise looks well.  She is in no acute distress, has no evidence of neurovascular deficits or any trauma.  Blood work with some stigmata of dehydration with elevated BUN and slight increase of her creatinine, for which she received oral fluids and a liter of LR IV.  Subsequently with resolved symptoms and feeling well.  Considering her decreased p.o. intake while trying to lose weight and her hot bath this morning, I suspect that she vasovagal and was relatively vasodilated causing her presyncope.  We discussed return precautions for the ED and patient is medically stable for outpatient management.  Clinical Course as of 07/14/20 1124  Mon Jul 14, 2020  1008 Reassessed.  Patient reports feeling  well.  Fluids about halfway done. [DS]    Clinical Course User Index [DS] Delton Prairie, MD    ____________________________________________   FINAL CLINICAL IMPRESSION(S) / ED DIAGNOSES  Final diagnoses:  Near syncope  Dehydration  Vasovagal episode     ED Discharge Orders    None       Dylan Katrinka Blazing   Note:  This document was prepared using Dragon voice recognition software and may include unintentional dictation errors.   Delton Prairie, MD 07/14/20 1126

## 2021-01-27 ENCOUNTER — Other Ambulatory Visit: Payer: Self-pay

## 2021-01-27 ENCOUNTER — Emergency Department: Payer: Medicare Other

## 2021-01-27 ENCOUNTER — Emergency Department
Admission: EM | Admit: 2021-01-27 | Discharge: 2021-01-27 | Disposition: A | Discharge status: Home / Self Care | Payer: Medicare Other | Attending: Emergency Medicine | Admitting: Emergency Medicine

## 2021-01-27 DIAGNOSIS — R21 Rash and other nonspecific skin eruption: Secondary | ICD-10-CM | POA: Insufficient documentation

## 2021-01-27 DIAGNOSIS — Z7982 Long term (current) use of aspirin: Secondary | ICD-10-CM | POA: Diagnosis not present

## 2021-01-27 DIAGNOSIS — Z79899 Other long term (current) drug therapy: Secondary | ICD-10-CM | POA: Insufficient documentation

## 2021-01-27 DIAGNOSIS — I10 Essential (primary) hypertension: Secondary | ICD-10-CM | POA: Diagnosis not present

## 2021-01-27 DIAGNOSIS — R519 Headache, unspecified: Secondary | ICD-10-CM | POA: Diagnosis not present

## 2021-01-27 LAB — CBC WITH DIFFERENTIAL/PLATELET
Abs Immature Granulocytes: 0.01 10*3/uL (ref 0.00–0.07)
Basophils Absolute: 0 10*3/uL (ref 0.0–0.1)
Basophils Relative: 1 %
Eosinophils Absolute: 0.2 10*3/uL (ref 0.0–0.5)
Eosinophils Relative: 4 %
HCT: 37.9 % (ref 36.0–46.0)
Hemoglobin: 12.7 g/dL (ref 12.0–15.0)
Immature Granulocytes: 0 %
Lymphocytes Relative: 28 %
Lymphs Abs: 1.2 10*3/uL (ref 0.7–4.0)
MCH: 28 pg (ref 26.0–34.0)
MCHC: 33.5 g/dL (ref 30.0–36.0)
MCV: 83.5 fL (ref 80.0–100.0)
Monocytes Absolute: 0.5 10*3/uL (ref 0.1–1.0)
Monocytes Relative: 13 %
Neutro Abs: 2.2 10*3/uL (ref 1.7–7.7)
Neutrophils Relative %: 54 %
Platelets: 334 10*3/uL (ref 150–400)
RBC: 4.54 MIL/uL (ref 3.87–5.11)
RDW: 13.1 % (ref 11.5–15.5)
WBC: 4.1 10*3/uL (ref 4.0–10.5)
nRBC: 0 % (ref 0.0–0.2)

## 2021-01-27 LAB — BASIC METABOLIC PANEL
Anion gap: 8 (ref 5–15)
BUN: 18 mg/dL (ref 8–23)
CO2: 29 mmol/L (ref 22–32)
Calcium: 9 mg/dL (ref 8.9–10.3)
Chloride: 101 mmol/L (ref 98–111)
Creatinine, Ser: 1.06 mg/dL — ABNORMAL HIGH (ref 0.44–1.00)
GFR, Estimated: 57 mL/min — ABNORMAL LOW (ref >60)
Glucose, Bld: 100 mg/dL — ABNORMAL HIGH (ref 70–99)
Potassium: 4 mmol/L (ref 3.5–5.1)
Sodium: 138 mmol/L (ref 135–145)

## 2021-01-27 LAB — C-REACTIVE PROTEIN: CRP: 0.8 mg/dL (ref <1.0)

## 2021-01-27 LAB — SEDIMENTATION RATE: Sed Rate: 30 mm/hr (ref 0–30)

## 2021-01-27 NOTE — ED Triage Notes (Signed)
HEAD PAIN , NO NAUSEA, NO VOMITING , SHARP PAIN TO LEFT SIDE X 3 DAYS

## 2021-01-27 NOTE — ED Notes (Signed)
See triage note  presents with left sided headache since Sunday  denies nay fever or n/v  states she took a COVID test yesterday which was negative  afebrile on arrival

## 2021-01-27 NOTE — ED Provider Notes (Signed)
[facility] Emergency Department Provider Note ____________________________________________   Event Date/Time   First MD Initiated Contact with Patient 01/27/21 1047     (approximate)  I have reviewed the triage vital signs and the nursing notes.  HISTORY  Chief Complaint Headache (SHARP PAIN TO LEFT SIDE OF HEAD, NO NAUSEA )   HPI Tina Hannan is a 70 y.o. femalewho presents to the ED for evaluation of headache.   Chart review indicates hx HTN, HLD.   Patient reports developing atraumatic and novel headache to left-sided temporal over the past 2-3 days.  She reports a sharp pain to her left temporum that she has never felt before.  Reports constant pain that is improved with Tylenol.  Nonradiating.  Denies trauma or injuries, nausea, emesis, vision changes, fever, pain with mastication, auditory changes or ringing in the ears. She reports a brother who died from glioblastoma in his 50s, and another brother with an aneurysm.  Past Medical History:  Diagnosis Date   Hyperlipidemia    Hypertension     There are no problems to display for this patient.   Past Surgical History:  Procedure Laterality Date   COLONOSCOPY N/A 11/07/2014   Procedure: COLONOSCOPY;  Surgeon: Christena Deem, MD;  Location: [facility] ENDOSCOPY;  Service: Endoscopy;  Laterality: N/A;    Prior to Admission medications   Medication Sig Start Date End Date Taking? Authorizing Provider  aspirin 81 MG tablet Take 81 mg by mouth daily.    [provider]  Omega-3 Fatty Acids (FISH OIL) 1000 MG CAPS Take 1 capsule by mouth daily.    [provider]  predniSONE (DELTASONE) 10 MG tablet Take 6 tablets  today, on day 2 take 5 tablets, day 3 take 4 tablets, day 4 take 3 tablets, day 5 take  2 tablets and 1 tablet the last day 01/16/17   Tommi Rumps, PA-C  ranitidine (ZANTAC) 150 MG tablet Take 1 tablet (150 mg total) by mouth 2 (two) times daily. 01/16/17 01/16/18   Tommi Rumps, PA-C  triamterene-hydrochlorothiazide (DYAZIDE) 37.5-25 MG per capsule Take 1 capsule by mouth daily.    [provider]    Allergies Patient has no known allergies.  Family History  Problem Relation Age of Onset   Breast cancer Maternal Aunt     Social History Social History   Tobacco Use   Smoking status: Never   Smokeless tobacco: Never  Substance Use Topics   Alcohol use: No   Drug use: No    Review of Systems  Constitutional: No fever/chills Eyes: No visual changes. ENT: No sore throat. Cardiovascular: Denies chest pain. Respiratory: Denies shortness of breath. Gastrointestinal: No abdominal pain.  No nausea, no vomiting.  No diarrhea.  No constipation. Genitourinary: Negative for dysuria. Musculoskeletal: Negative for back pain. Skin: Negative for rash. Neurological: Negative for  focal weakness or numbness. Positive atraumatic headache  ____________________________________________   PHYSICAL EXAM:  VITAL SIGNS: Vitals:   01/27/21 1007  BP: (!) 125/92  Pulse: 81  Resp: 17  Temp: 98.8 F (37.1 C)  SpO2: 98%     Constitutional: Alert and oriented. Well appearing and in no acute distress. Eyes: Conjunctivae are normal. PERRL. EOMI. Head: Atraumatic. No tenderness to the left temporal region, no skin changes, signs of trauma or swelling. Nose: No congestion/rhinnorhea. Mouth/Throat: Mucous membranes are moist.  Oropharynx non-erythematous. Neck: No stridor. No cervical spine tenderness to palpation. Cardiovascular: Normal rate, regular rhythm. Grossly normal heart sounds.  Good peripheral circulation. Respiratory: Normal respiratory effort.  No retractions. Lungs CTAB. Gastrointestinal: Soft , nondistended, nontender to palpation. No CVA tenderness. Musculoskeletal: No lower extremity tenderness nor edema.  No joint effusions. No signs of acute trauma. Neurologic:  Normal speech and language. No gross focal neurologic  deficits are appreciated. No gait instability noted. Cranial nerves II through XII intact 5/5 strength and sensation in all 4 extremities Skin:  Skin is warm, dry and intact. No rash noted. Psychiatric: Mood and affect are normal. Speech and behavior are normal.  ____________________________________________   LABS (all labs ordered are listed, but only abnormal results are displayed)  Labs Reviewed  BASIC METABOLIC PANEL - Abnormal; Notable for the following components:      Result Value   Glucose, Bld 100 (*)    Creatinine, Ser 1.06 (*)    GFR, Estimated 57 (*)    All other components within normal limits  CBC WITH DIFFERENTIAL/PLATELET  SEDIMENTATION RATE  C-REACTIVE PROTEIN   ____________________________________________  12 Lead EKG   ____________________________________________  RADIOLOGY  ED MD interpretation: CT head reviewed by me without evidence of acute intracranial pathology.  Official radiology report(s): CT HEAD WO CONTRAST ( )  Result Date: 01/27/2021 CLINICAL DATA:  Left temporal pain EXAM: CT HEAD WITHOUT CONTRAST TECHNIQUE: Contiguous axial images were obtained from the base of the skull through the vertex without intravenous contrast. COMPARISON:  Brain MRI 07/28/2010 FINDINGS: Brain: There is no evidence of acute intracranial hemorrhage, extra-axial fluid collection, or infarct. There is no mass lesion. The ventricles are not enlarged. There is no midline shift. Vascular: No hyperdense vessel or unexpected calcification. Skull: Normal. Negative for fracture or focal lesion. Sinuses/Orbits: The imaged paranasal sinuses are clear. The imaged globes and orbits are unremarkable. Other: None. IMPRESSION: No acute intracranial pathology; no mass lesion identified. Electronically Signed   By: Lesia Hausen M.D.   On: 01/27/2021 12:21    ____________________________________________   PROCEDURES and INTERVENTIONS  Procedure(s) performed (including Critical  Care):  Procedures  Medications - No data to display  ____________________________________________   MDM / ED COURSE   Pleasant and quite functional 70 year old woman presents to the ED with a novel left-sided headache, without evidence of significant acute pathology, and amenable to outpatient management.  No evidence of temporal arteritis, intracranial pathology such as mass lesion.  No signs of neurologic or vascular deficits.  No indications for emergent MRI.  We discussed following with her PCP and return precautions for the ED.  Clinical Course as of 01/27/21 1338  Tue Jan 27, 2021  1145 We discussed possible etiologies of pain [DS]  1333 Reassessed.  Patient reports feeling well.  We discussed benign work-up without evidence of intracranial pathology or inflammatory elevation to suggest temporal arteritis.  We discussed management at home with Tylenol and return precautions for the ED.  We discussed following up with PCP. [DS]    Clinical Course User Index [DS] Delton Prairie, MD    ____________________________________________   FINAL CLINICAL IMPRESSION(S) / ED DIAGNOSES  Final diagnoses:  Acute nonintractable headache, unspecified headache type     ED Discharge Orders     None        Lenay Lovejoy   Note:  This document was prepared using Dragon voice recognition software and may include unintentional dictation errors.    Delton Prairie, MD 01/27/21 1340

## 2021-01-27 NOTE — Discharge Instructions (Signed)
Use Tylenol for pain and fevers.  Up to 1000 mg per dose, up to 4 times per day.  Do not take more than 4000 mg of Tylenol/acetaminophen within 24 hours..  If you develop any severe headaches despite the Tylenol, fevers, vision changes or other worsening features, please return to the ED.

## 2021-06-04 ENCOUNTER — Other Ambulatory Visit: Payer: Self-pay | Admitting: Family Medicine

## 2021-06-04 DIAGNOSIS — Z1231 Encounter for screening mammogram for malignant neoplasm of breast: Secondary | ICD-10-CM

## 2021-07-13 ENCOUNTER — Other Ambulatory Visit: Payer: Self-pay

## 2021-07-13 ENCOUNTER — Ambulatory Visit
Admission: RE | Admit: 2021-07-13 | Discharge: 2021-07-13 | Disposition: A | Discharge status: Home / Self Care | Payer: Medicare Other | Source: Ambulatory Visit | Attending: Family Medicine | Admitting: Family Medicine

## 2021-07-13 DIAGNOSIS — Z1231 Encounter for screening mammogram for malignant neoplasm of breast: Secondary | ICD-10-CM | POA: Insufficient documentation

## 2022-03-04 IMAGING — CT CT HEAD W/O CM
3 series · 16 of 45 positions shown, 19 images · non-contrast
Comparison: Brain MRI 07/28/2010

CLINICAL DATA: Left temporal pain

EXAM:
CT HEAD WITHOUT CONTRAST
TECHNIQUE: Contiguous axial images were obtained from the base of the skull
through the vertex without intravenous contrast.

[Series 2: head wo · axial · 0.40mm/px · z∈[+271,+386]mm · 10 of 28 slices shown, 13 images]
[im 3/28  brain]
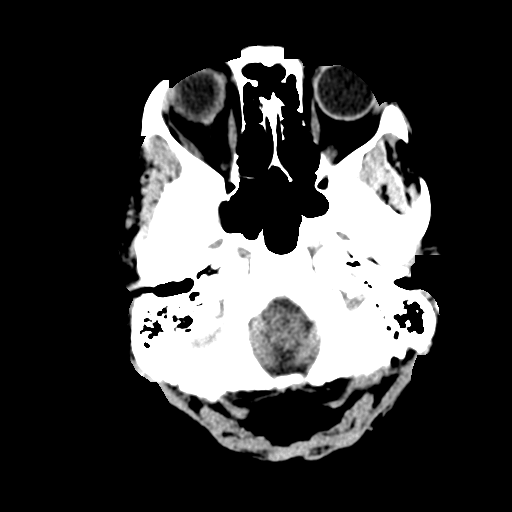
[im 3/28  bone]
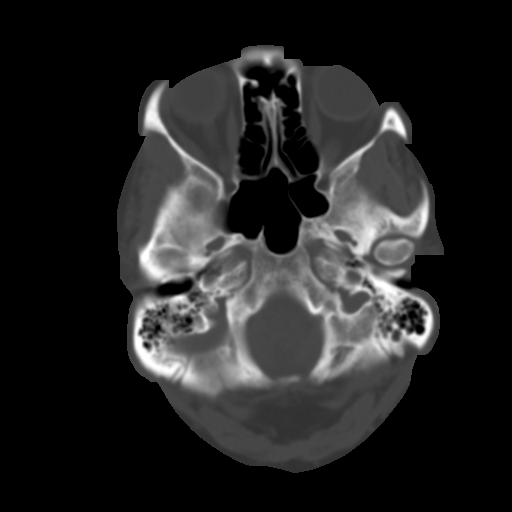
[im 5/28  brain]
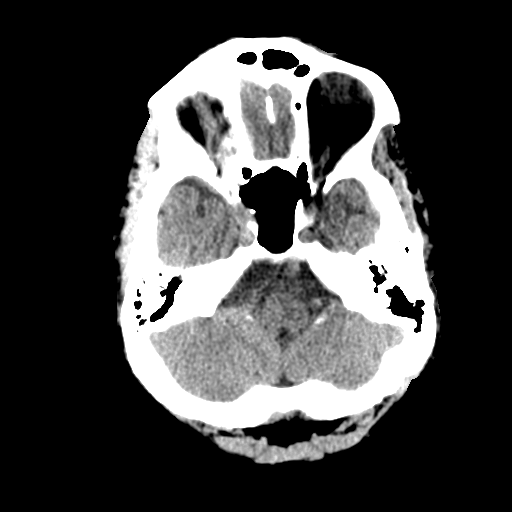
[im 8/28  brain]
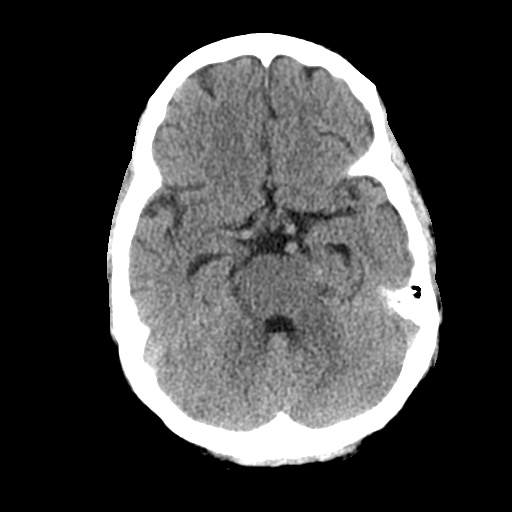
[im 11/28  brain]
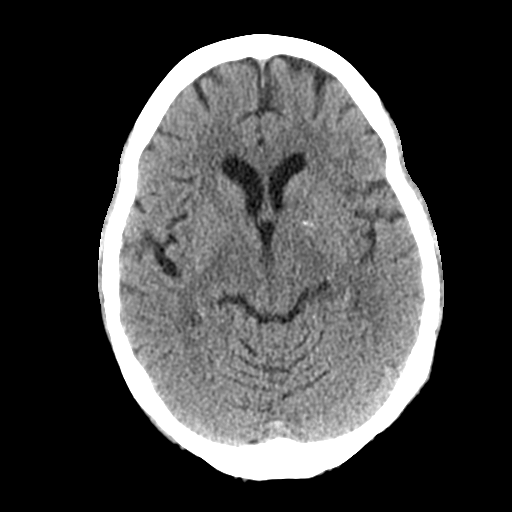
[im 13/28  brain]
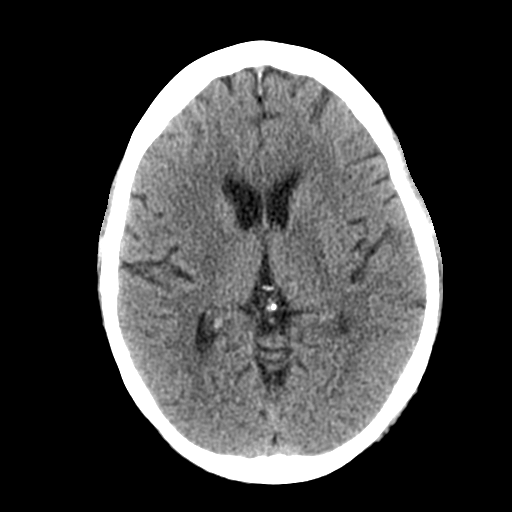
[im 13/28  bone]
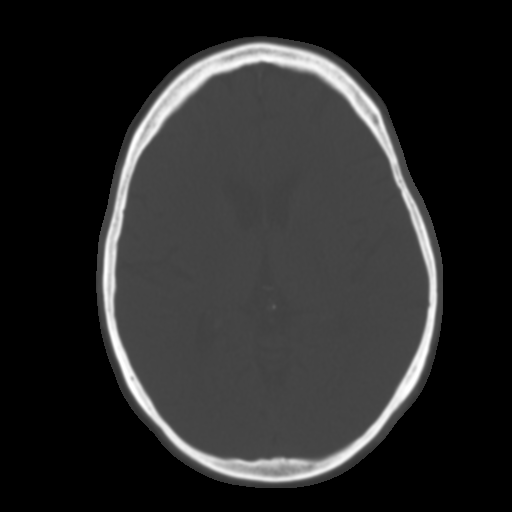
[im 16/28  brain]
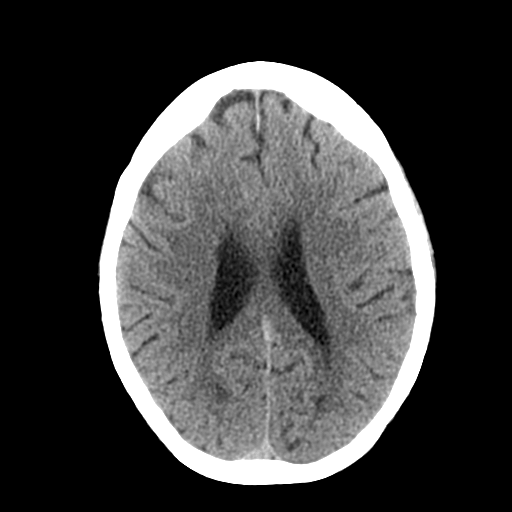
[im 18/28  brain]
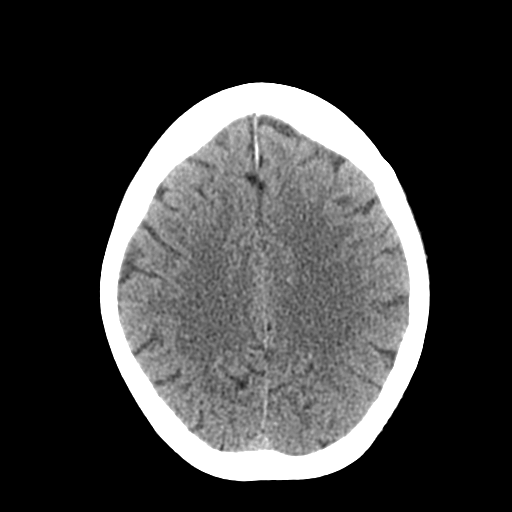
[im 21/28  brain]
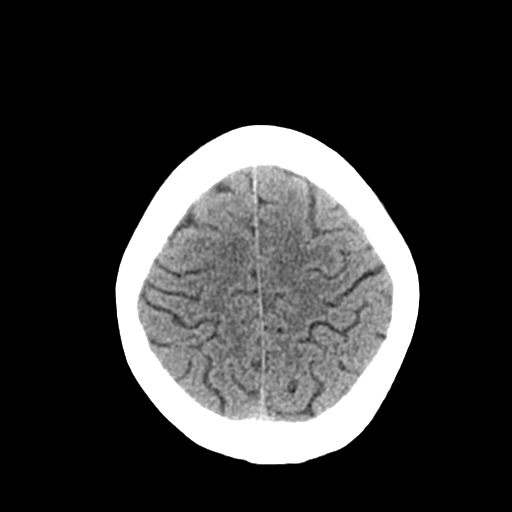
[im 24/28  brain]
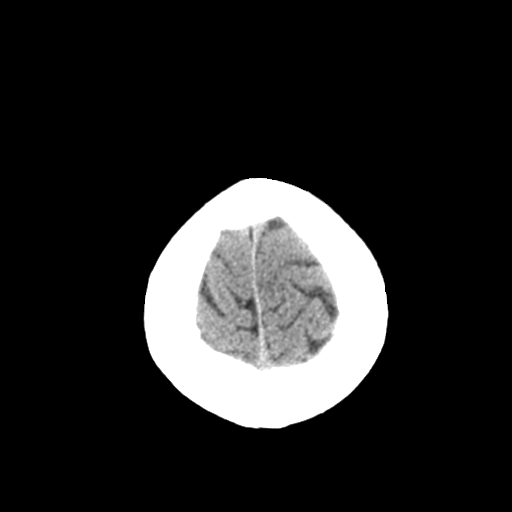
[im 24/28  bone]
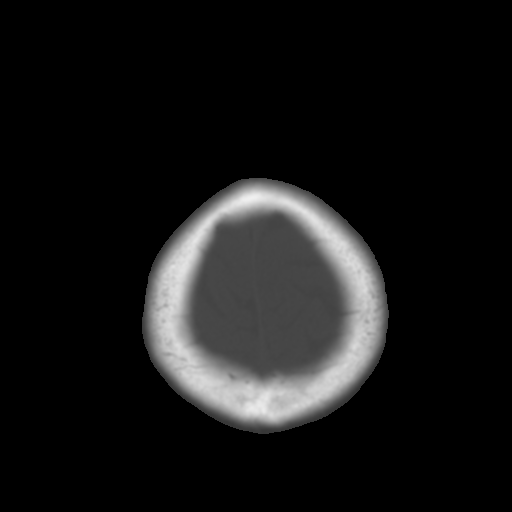
[im 26/28  brain]
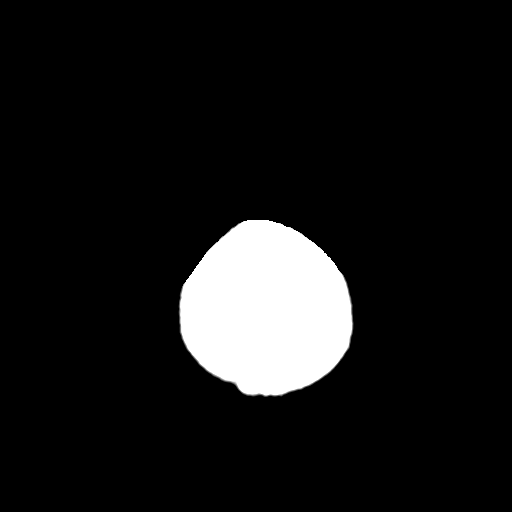

[Series 4: coronal soft tissue · coronal · 0.29mm/px · 3 of 64 slices shown]
[im 22/64  brain]
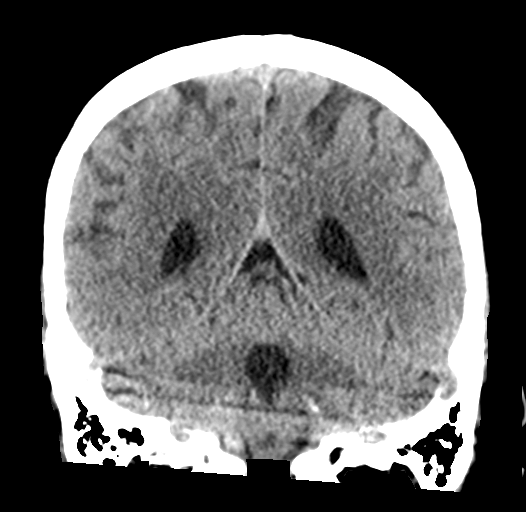
[im 29/64  brain]
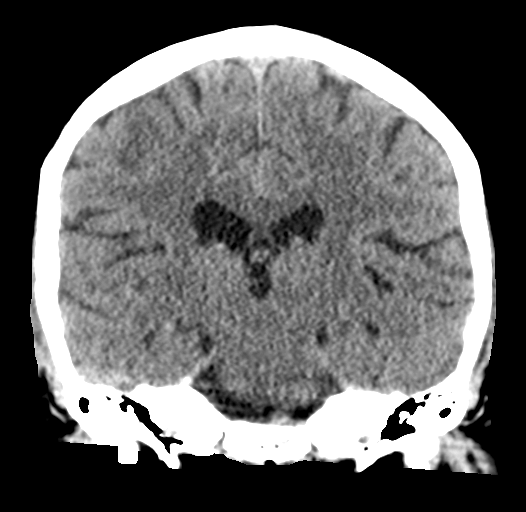
[im 36/64  brain]
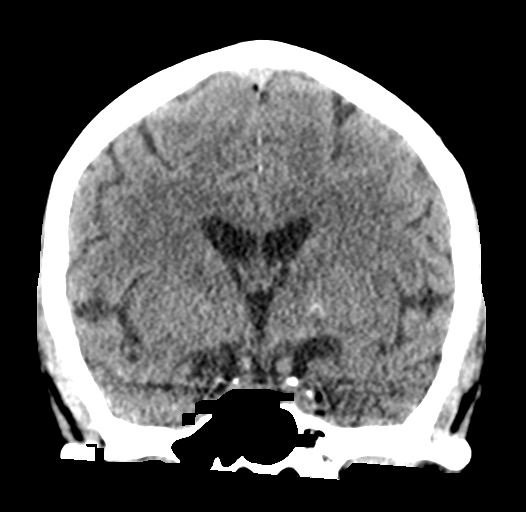

[Series 5: sagittal soft tissue · sagittal · 0.29mm/px · 3 of 52 slices shown]
[im 18/52  brain]
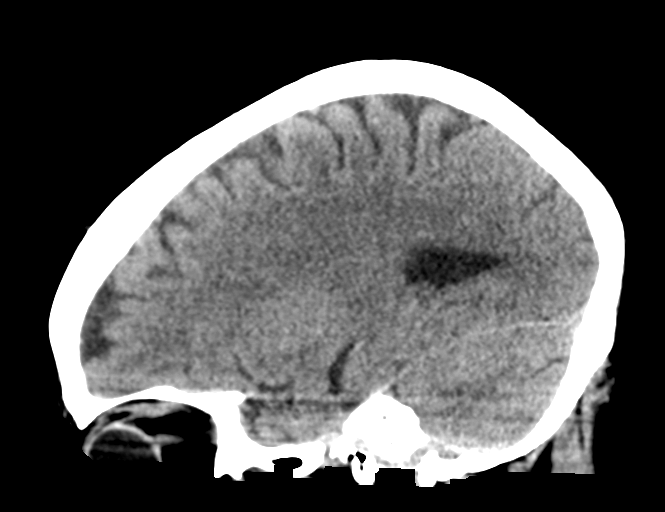
[im 26/52  brain]
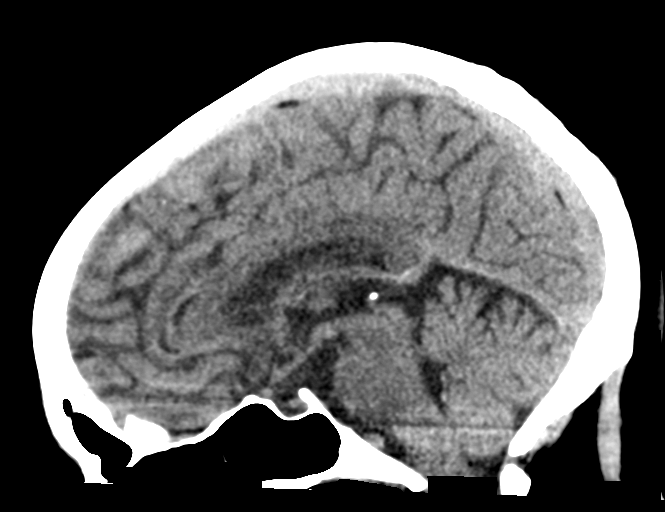
[im 35/52  brain]
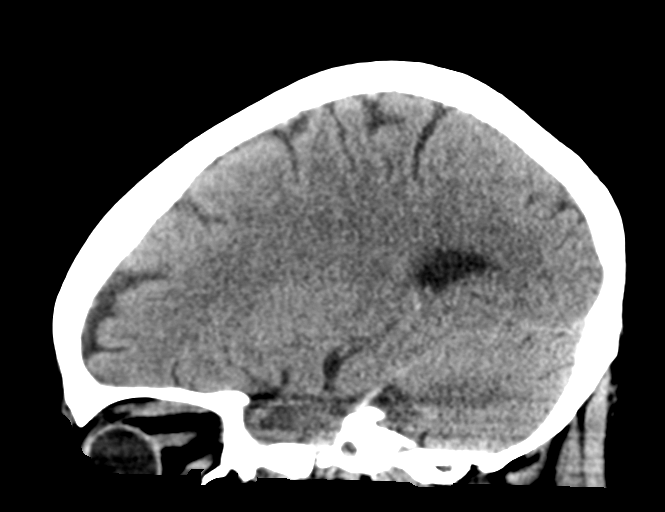

[16 of 45 positions shown; findings below may reference images not displayed]

FINDINGS: Brain: There is no evidence of acute intracranial hemorrhage,
extra-axial fluid collection, or infarct.

There is no mass lesion. The ventricles are not enlarged. There is
no midline shift.

Vascular: No hyperdense vessel or unexpected calcification.

Skull: Normal. Negative for fracture or focal lesion.

Sinuses/Orbits: The imaged paranasal sinuses are clear. The imaged
globes and orbits are unremarkable.

Other: None.
IMPRESSION: No acute intracranial pathology; no mass lesion identified.

## 2022-03-31 ENCOUNTER — Emergency Department: Payer: Medicare Other

## 2022-03-31 ENCOUNTER — Other Ambulatory Visit: Payer: Self-pay

## 2022-03-31 ENCOUNTER — Emergency Department
Admission: EM | Admit: 2022-03-31 | Discharge: 2022-03-31 | Disposition: A | Discharge status: Home / Self Care | Payer: Medicare Other | Attending: Emergency Medicine | Admitting: Emergency Medicine

## 2022-03-31 DIAGNOSIS — R519 Headache, unspecified: Secondary | ICD-10-CM | POA: Insufficient documentation

## 2022-03-31 DIAGNOSIS — Z1152 Encounter for screening for COVID-19: Secondary | ICD-10-CM | POA: Diagnosis not present

## 2022-03-31 DIAGNOSIS — I10 Essential (primary) hypertension: Secondary | ICD-10-CM | POA: Insufficient documentation

## 2022-03-31 LAB — CBC WITH DIFFERENTIAL/PLATELET
Abs Immature Granulocytes: 0.01 10*3/uL (ref 0.00–0.07)
Basophils Absolute: 0 10*3/uL (ref 0.0–0.1)
Basophils Relative: 1 %
Eosinophils Absolute: 0.1 10*3/uL (ref 0.0–0.5)
Eosinophils Relative: 3 %
HCT: 38 % (ref 36.0–46.0)
Hemoglobin: 12.5 g/dL (ref 12.0–15.0)
Immature Granulocytes: 0 %
Lymphocytes Relative: 39 %
Lymphs Abs: 1.7 10*3/uL (ref 0.7–4.0)
MCH: 27.1 pg (ref 26.0–34.0)
MCHC: 32.9 g/dL (ref 30.0–36.0)
MCV: 82.4 fL (ref 80.0–100.0)
Monocytes Absolute: 0.3 10*3/uL (ref 0.1–1.0)
Monocytes Relative: 7 %
Neutro Abs: 2.2 10*3/uL (ref 1.7–7.7)
Neutrophils Relative %: 50 %
Platelets: 352 10*3/uL (ref 150–400)
RBC: 4.61 MIL/uL (ref 3.87–5.11)
RDW: 12.9 % (ref 11.5–15.5)
WBC: 4.3 10*3/uL (ref 4.0–10.5)
nRBC: 0 % (ref 0.0–0.2)

## 2022-03-31 LAB — BASIC METABOLIC PANEL
Anion gap: 9 (ref 5–15)
BUN: 20 mg/dL (ref 8–23)
CO2: 26 mmol/L (ref 22–32)
Calcium: 8.7 mg/dL — ABNORMAL LOW (ref 8.9–10.3)
Chloride: 103 mmol/L (ref 98–111)
Creatinine, Ser: 0.86 mg/dL (ref 0.44–1.00)
GFR, Estimated: >60 mL/min (ref >60)
Glucose, Bld: 93 mg/dL (ref 70–99)
Potassium: 3.7 mmol/L (ref 3.5–5.1)
Sodium: 138 mmol/L (ref 135–145)

## 2022-03-31 LAB — RESP PANEL BY RT-PCR (FLU A&B, COVID) ARPGX2
Influenza A by PCR: NEGATIVE
Influenza B by PCR: NEGATIVE
SARS Coronavirus 2 by RT PCR: NEGATIVE

## 2022-03-31 MED ORDER — DIPHENHYDRAMINE HCL 50 MG/ML IJ SOLN
25.0000 mg | Freq: Once | INTRAMUSCULAR | Status: AC
  Administered 2022-03-31: 25 mg via INTRAVENOUS
  Filled 2022-03-31: qty 1

## 2022-03-31 MED ORDER — LACTATED RINGERS IV BOLUS
1000.0000 mL | Freq: Once | INTRAVENOUS | Status: AC
  Administered 2022-03-31: 1000 mL via INTRAVENOUS

## 2022-03-31 MED ORDER — PROCHLORPERAZINE EDISYLATE 10 MG/2ML IJ SOLN
5.0000 mg | Freq: Once | INTRAMUSCULAR | Status: AC
  Administered 2022-03-31: 5 mg via INTRAVENOUS
  Filled 2022-03-31: qty 2

## 2022-03-31 NOTE — ED Triage Notes (Signed)
C/O tension headache to left temple since yesterday.  Took Extra tylenol x 2 yesterday and this morning at 0500, headache persisted.  AAOx3.  Skin warm and dry. NAD

## 2022-03-31 NOTE — ED Notes (Signed)
Report given to Alyssa, RN 

## 2022-03-31 NOTE — ED Notes (Signed)
ED provider at bedside. Pt has L temporal HA since yesterday and has taken tylenol several times. Denies N/V, numbness, weakness to extremities and vision changes.

## 2022-03-31 NOTE — ED Notes (Signed)
ED Provider at bedside. 

## 2022-03-31 NOTE — ED Provider Notes (Signed)
[facility] Provider Note    Event Date/Time   First MD Initiated Contact with Patient 03/31/22 1703     (approximate)   History   Chief Complaint Headache   HPI  Tina Barrett is a 71 y.o. female with past medical history of hypertension and hyperlipidemia presents to the ED complaining of headache.  Patient reports that she has been dealing with waxing and waning headache since yesterday morning.  She noticed throbbing emanating from the area of her left temple yesterday morning when she woke up, has taken Tylenol a couple of times since then with partial relief.  When headache persisted today, she decided to seek care in the ED.  She states that she does not typically deal with headaches, denies any history of migraines.  She has otherwise been feeling well with no fevers or neck stiffness, denies any vision changes, speech changes, numbness, or weakness.     Physical Exam   Triage Vital Signs: ED Triage Vitals  Enc Vitals Group     BP 03/31/22 1618 (!) 140/79     Pulse Rate 03/31/22 1618 72     Resp 03/31/22 1618 16     Temp 03/31/22 1618 97.6 F (36.4 C)     Temp Source 03/31/22 1618 Oral     SpO2 03/31/22 1618 97 %     Weight 03/31/22 1616 149 lb 14.6 oz (68 kg)     Height 03/31/22 1616 5' (1.524 m)     Head Circumference --      Peak Flow --      Pain Score 03/31/22 1615 7     Pain Loc --      Pain Edu? --      Excl. in GC? --     Most recent vital signs: Vitals:   03/31/22 1815 03/31/22 1939  BP: 127/74 130/72  Pulse: 80 78  Resp: 16 16  Temp:    SpO2: 97% 98%    Constitutional: Alert and oriented. Eyes: Conjunctivae are normal. Head: Atraumatic.  No temporal tenderness noted. Nose: No congestion/rhinnorhea. Mouth/Throat: Mucous membranes are moist.  Neck: Supple with no meningismus. Cardiovascular: Normal rate, regular rhythm. Grossly normal heart sounds.  2+ radial pulses bilaterally. Respiratory: Normal respiratory  effort.  No retractions. Lungs CTAB. Gastrointestinal: Soft and nontender. No distention. Musculoskeletal: No lower extremity tenderness nor edema.  Neurologic:  Normal speech and language. No gross focal neurologic deficits are appreciated.    ED Results / Procedures / Treatments   Labs (all labs ordered are listed, but only abnormal results are displayed) Labs Reviewed  BASIC METABOLIC PANEL - Abnormal; Notable for the following components:      Result Value   Calcium 8.7 (*)    All other components within normal limits  RESP PANEL BY RT-PCR (FLU A&B, COVID) [id number]  CBC WITH DIFFERENTIAL/PLATELET    RADIOLOGY CT head reviewed and interpreted by me with no hemorrhage or midline shift.  PROCEDURES:  Critical Care performed: No  Procedures   MEDICATIONS ORDERED IN ED: Medications  prochlorperazine (COMPAZINE) injection 5 mg (5 mg Intravenous Given 03/31/22 1811)  diphenhydrAMINE (BENADRYL) injection 25 mg (25 mg Intravenous Given 03/31/22 1813)  lactated ringers bolus 1,000 mL (0 mLs Intravenous Stopped 03/31/22 1926)     IMPRESSION / MDM / ASSESSMENT AND PLAN / ED COURSE  I reviewed the triage vital signs and the nursing notes.  71 y.o. female with past medical history of hypertension and hyperlipidemia who presents to the ED complaining of waxing and waning headache since yesterday morning emanating from her left temple.  Patient's presentation is most consistent with acute presentation with potential threat to life or bodily function.  Differential diagnosis includes, but is not limited to, SAH, meningitis, tension headache, migraine headache, temporal arteritis.  Patient well-appearing and in no acute distress, vital signs are unremarkable and she has a nonfocal neurologic exam.  Given her older age with new onset headache, will check CT head, but overall suspicion for [facility] is low.  She has no temporal tenderness or visual symptoms to  indicate temporal arteritis.  Plan to screen basic labs and treat symptomatically with IV Compazine and Benadryl, hydrate with IV fluids.  CT head is negative for acute process, labs are unremarkable with no significant anemia, leukocytosis, tract abnormality, or AKI.  On reassessment, patient reports she is feeling better with improved headache.  Given reassuring work-up, patient is appropriate for discharge home with PCP follow-up.  She was counseled to return to the ED for new or worsening symptoms, patient agrees with plan.      FINAL CLINICAL IMPRESSION(S) / ED DIAGNOSES   Final diagnoses:  Acute nonintractable headache, unspecified headache type     Rx / DC Orders   ED Discharge Orders     None        Note:  This document was prepared using Dragon voice recognition software and may include unintentional dictation errors.   Chesley Noon, MD 03/31/22 2014

## 2022-03-31 NOTE — ED Notes (Signed)
Called lab to send more covid swabs.

## 2022-03-31 NOTE — ED Notes (Signed)
Signing pad didn't work. Pt verbalized understanding of DC instructions.

## 2022-06-18 ENCOUNTER — Other Ambulatory Visit: Payer: Self-pay | Admitting: Family Medicine

## 2022-06-18 DIAGNOSIS — Z1231 Encounter for screening mammogram for malignant neoplasm of breast: Secondary | ICD-10-CM

## 2022-07-14 ENCOUNTER — Ambulatory Visit
Admission: RE | Admit: 2022-07-14 | Discharge: 2022-07-14 | Disposition: A | Discharge status: Home / Self Care | Payer: Medicare Other | Source: Ambulatory Visit | Attending: Family Medicine | Admitting: Family Medicine

## 2022-07-14 DIAGNOSIS — Z1231 Encounter for screening mammogram for malignant neoplasm of breast: Secondary | ICD-10-CM

## 2023-06-17 ENCOUNTER — Other Ambulatory Visit: Payer: Self-pay | Admitting: Family Medicine

## 2023-06-17 DIAGNOSIS — Z1231 Encounter for screening mammogram for malignant neoplasm of breast: Secondary | ICD-10-CM

## 2023-07-18 ENCOUNTER — Ambulatory Visit
Admission: RE | Admit: 2023-07-18 | Discharge: 2023-07-18 | Disposition: A | Discharge status: Home / Self Care | Payer: Medicare Other | Source: Ambulatory Visit | Attending: Family Medicine | Admitting: Family Medicine

## 2023-07-18 DIAGNOSIS — Z1231 Encounter for screening mammogram for malignant neoplasm of breast: Secondary | ICD-10-CM | POA: Insufficient documentation

## 2024-06-20 ENCOUNTER — Other Ambulatory Visit: Payer: Self-pay | Admitting: Family Medicine

## 2024-06-20 DIAGNOSIS — Z1231 Encounter for screening mammogram for malignant neoplasm of breast: Secondary | ICD-10-CM

## 2024-07-26 ENCOUNTER — Encounter
